# Patient Record
Sex: Female | Born: 1935
Health system: Southern US, Community
[De-identification: ages and names within clinical notes are randomized; demographics above are authoritative.]

## PROBLEM LIST (undated history)

## (undated) DIAGNOSIS — I499 Cardiac arrhythmia, unspecified: Secondary | ICD-10-CM

## (undated) HISTORY — PX: CHOLECYSTECTOMY: SHX55

---

## 2015-04-20 ENCOUNTER — Observation Stay (HOSPITAL_COMMUNITY)
Admission: AD | Admit: 2015-04-20 | Discharge: 2015-04-22 | Disposition: A | Discharge status: Skilled Nursing Facility | Payer: Medicare Other | Source: Other Acute Inpatient Hospital | Attending: Internal Medicine | Admitting: Internal Medicine

## 2015-04-20 ENCOUNTER — Telehealth: Payer: Self-pay | Admitting: Family Medicine

## 2015-04-20 DIAGNOSIS — R296 Repeated falls: Secondary | ICD-10-CM | POA: Insufficient documentation

## 2015-04-20 DIAGNOSIS — Z9181 History of falling: Secondary | ICD-10-CM | POA: Insufficient documentation

## 2015-04-20 DIAGNOSIS — S12000A Unspecified displaced fracture of first cervical vertebra, initial encounter for closed fracture: Principal | ICD-10-CM | POA: Insufficient documentation

## 2015-04-20 DIAGNOSIS — Y92009 Unspecified place in unspecified non-institutional (private) residence as the place of occurrence of the external cause: Secondary | ICD-10-CM | POA: Diagnosis not present

## 2015-04-20 DIAGNOSIS — F0390 Unspecified dementia without behavioral disturbance: Secondary | ICD-10-CM | POA: Diagnosis not present

## 2015-04-20 DIAGNOSIS — I482 Chronic atrial fibrillation, unspecified: Secondary | ICD-10-CM | POA: Diagnosis present

## 2015-04-20 DIAGNOSIS — W1800XA Striking against unspecified object with subsequent fall, initial encounter: Secondary | ICD-10-CM | POA: Diagnosis not present

## 2015-04-20 DIAGNOSIS — S12111A Posterior displaced Type II dens fracture, initial encounter for closed fracture: Secondary | ICD-10-CM | POA: Insufficient documentation

## 2015-04-20 DIAGNOSIS — W19XXXA Unspecified fall, initial encounter: Secondary | ICD-10-CM

## 2015-04-20 HISTORY — DX: Cardiac arrhythmia, unspecified: I49.9

## 2015-04-20 NOTE — Telephone Encounter (Signed)
Accepted to med-surg bed at [facility] in transfer from [facility].  [place] while getting out of a chair at home and suffered C-1 Jefferson fx, dens fracture. She is hemodynamically stable and no without neuro deficit. Dr. Venetia Maxon of [facility] is consulting, but is not anticipating any surgical intervention.

## 2015-04-20 NOTE — Progress Notes (Signed)
Pt admitted from [facility] after a fall, alert and oriented with [place] neck collar in place, pt settled in bed with family and call light at bedside, admitting doc paged for orders, pt reassured, will however continue to monitor. Obasogie-Asidi, Peretz Thieme Efe

## 2015-04-20 NOTE — Progress Notes (Signed)
Pt is yet to be seen by the admitting doctor, pt's family getting concerned, [facility] Admissions re-paged and notified, yet to call back. Obasogie-Asidi, Zuly Belkin Efe

## 2015-04-21 ENCOUNTER — Observation Stay (HOSPITAL_COMMUNITY): Payer: Medicare Other

## 2015-04-21 ENCOUNTER — Encounter (HOSPITAL_COMMUNITY): Payer: Self-pay | Admitting: Internal Medicine

## 2015-04-21 DIAGNOSIS — I482 Chronic atrial fibrillation, unspecified: Secondary | ICD-10-CM | POA: Diagnosis present

## 2015-04-21 DIAGNOSIS — S12000A Unspecified displaced fracture of first cervical vertebra, initial encounter for closed fracture: Secondary | ICD-10-CM | POA: Diagnosis present

## 2015-04-21 LAB — COMPREHENSIVE METABOLIC PANEL
ALT: 14 U/L (ref 14–54)
AST: 24 U/L (ref 15–41)
Albumin: 3.5 g/dL (ref 3.5–5.0)
Alkaline Phosphatase: 48 U/L (ref 38–126)
Anion gap: 11 (ref 5–15)
BUN: 19 mg/dL (ref 6–20)
CALCIUM: 9.3 mg/dL (ref 8.9–10.3)
CHLORIDE: 104 mmol/L (ref 101–111)
CO2: 26 mmol/L (ref 22–32)
CREATININE: 0.71 mg/dL (ref 0.44–1.00)
Glucose, Bld: 124 mg/dL — ABNORMAL HIGH (ref 65–99)
Potassium: 4.3 mmol/L (ref 3.5–5.1)
Sodium: 141 mmol/L (ref 135–145)
Total Bilirubin: 1.1 mg/dL (ref 0.3–1.2)
Total Protein: 5.8 g/dL — ABNORMAL LOW (ref 6.5–8.1)

## 2015-04-21 LAB — CBC WITH DIFFERENTIAL/PLATELET
BASOS PCT: 0 %
Basophils Absolute: 0 10*3/uL (ref 0.0–0.1)
EOS ABS: 0 10*3/uL (ref 0.0–0.7)
Eosinophils Relative: 1 %
HCT: 33.9 % — ABNORMAL LOW (ref 36.0–46.0)
HEMOGLOBIN: 11.4 g/dL — AB (ref 12.0–15.0)
LYMPHS ABS: 0.9 10*3/uL (ref 0.7–4.0)
Lymphocytes Relative: 20 %
MCH: 33.7 pg (ref 26.0–34.0)
MCHC: 33.6 g/dL (ref 30.0–36.0)
MCV: 100.3 fL — ABNORMAL HIGH (ref 78.0–100.0)
Monocytes Absolute: 0.4 10*3/uL (ref 0.1–1.0)
Monocytes Relative: 9 %
NEUTROS PCT: 70 %
Neutro Abs: 3.3 10*3/uL (ref 1.7–7.7)
Platelets: 181 10*3/uL (ref 150–400)
RBC: 3.38 MIL/uL — AB (ref 3.87–5.11)
RDW: 13.2 % (ref 11.5–15.5)
WBC: 4.7 10*3/uL (ref 4.0–10.5)

## 2015-04-21 MED ORDER — DONEPEZIL HCL 10 MG PO TABS
10.0000 mg | ORAL_TABLET | Freq: Every day | ORAL | Status: DC
  Administered 2015-04-21: 10 mg via ORAL
  Filled 2015-04-21: qty 1

## 2015-04-21 MED ORDER — ONDANSETRON HCL 4 MG/2ML IJ SOLN
4.0000 mg | Freq: Four times a day (QID) | INTRAMUSCULAR | Status: DC | PRN
  Filled 2015-04-21: qty 2

## 2015-04-21 MED ORDER — ONDANSETRON HCL 4 MG PO TABS
4.0000 mg | ORAL_TABLET | Freq: Four times a day (QID) | ORAL | Status: DC | PRN
  Administered 2015-04-22: 4 mg via ORAL

## 2015-04-21 MED ORDER — ACETAMINOPHEN 650 MG RE SUPP: 650.0000 mg | Freq: Four times a day (QID) | RECTAL | Status: DC | PRN

## 2015-04-21 MED ORDER — DIGOXIN 125 MCG PO TABS
0.1250 mg | ORAL_TABLET | Freq: Every day | ORAL | Status: DC
  Administered 2015-04-21 – 2015-04-22 (×2): 0.125 mg via ORAL
  Filled 2015-04-21 (×2): qty 1

## 2015-04-21 MED ORDER — TRAMADOL HCL 50 MG PO TABS
50.0000 mg | ORAL_TABLET | Freq: Four times a day (QID) | ORAL | Status: DC | PRN
  Administered 2015-04-21 – 2015-04-22 (×2): 50 mg via ORAL
  Filled 2015-04-21 (×2): qty 1

## 2015-04-21 MED ORDER — DONEPEZIL HCL 10 MG PO TBDP: 10.0000 mg | ORAL_TABLET | Freq: Every day | ORAL | Status: DC

## 2015-04-21 MED ORDER — DILTIAZEM HCL ER COATED BEADS 120 MG PO CP24
120.0000 mg | ORAL_CAPSULE | Freq: Every day | ORAL | Status: DC
  Administered 2015-04-21 – 2015-04-22 (×2): 120 mg via ORAL
  Filled 2015-04-21 (×4): qty 1

## 2015-04-21 MED ORDER — ACETAMINOPHEN 325 MG PO TABS
650.0000 mg | ORAL_TABLET | Freq: Four times a day (QID) | ORAL | Status: DC | PRN
  Administered 2015-04-22: 650 mg via ORAL
  Filled 2015-04-21: qty 2

## 2015-04-21 NOTE — Progress Notes (Signed)
PT Cancellation Note  Patient Details Name: Melinda Warner MRN: [id number] DOB: Jan 06, 1926   Cancelled Treatment:    Reason Eval/Treat Not Completed: Noted cervical fractures and neurosurgery consult pending. Will await results of this consult prior to initiating activity. Will follow and hope to see later today.   Gionna Polak 04/21/2015, 8:26 AM  Pager [phone]

## 2015-04-21 NOTE — Progress Notes (Signed)
Subjective: Patient reports "my neck (hurts) when I move some"   Objective: Vital signs in last 24 hours: Temp:  [97.5 F (36.4 C)-97.8 F (36.6 C)] 97.5 F (36.4 C) (02/06 0602) Pulse Rate:  [65-73] 65 (02/06 0602) Resp:  [18] 18 (02/06 0602) BP: (133-154)/(67-78) 135/67 mmHg (02/06 0602) SpO2:  [96 %-100 %] 96 % (02/06 0602) Weight:  [54.023 kg (119 lb 1.6 oz)] 54.023 kg (119 lb 1.6 oz) (02/05 2147)  Intake/Output from previous day:   Intake/Output this shift:    Alert, conversant, (hard of hearing) preparing to eat breakfast. MAEW. Has been up to [facility] with assistance.  [place] Collar in place. DSD to small forehead laceration.   Lab Results:  Recent Labs  04/21/15 0139  WBC 4.7  HGB 11.4*  HCT 33.9*  PLT 181   BMET  Recent Labs  04/21/15 0139  NA 141  K 4.3  CL 104  CO2 26  GLUCOSE 124*  BUN 19  CREATININE 0.71  CALCIUM 9.3    Studies/Results: Dg Pelvis Portable  04/21/2015  CLINICAL DATA:  82 year old female with fall EXAM: PORTABLE PELVIS 1-2 VIEWS COMPARISON:  None. FINDINGS: Evaluation for fracture is limited due to osteopenia. No definite acute fracture identified. There is no dislocation. There is advanced osteopenia. There are degenerative changes of the visualized lower spine. Moderate stool noted throughout the colon. IMPRESSION: Osteopenia.  No definite acute fracture. Electronically Signed   By: Elgie Collard M.D.   On: 04/21/2015 01:57    Assessment/Plan:   LOS: 1 day  Attempting to locate cervical images obtained at [facility]. If unable to locate, will need to repeat cervical CT. Dr Venetia Maxon will visit this am.    Georgiann Cocker 04/21/2015, 8:15 AM

## 2015-04-21 NOTE — Progress Notes (Signed)
Pt's urine was foul smelling.  MD made aware.  Will continue to monitor closely and update as needed.

## 2015-04-21 NOTE — Progress Notes (Signed)
Patient admitted after midnight, please see H&P.  No plan for surgery per NS-- will need change in neck brace, PT and follow up with Dr. Venetia Maxon Family at bedside-- plan to take patient home vs rehab (discussed observation status with family)  Marlin Canary DO

## 2015-04-21 NOTE — Evaluation (Signed)
Physical Therapy Evaluation Patient Details Name: Melinda Warner MRN: [id number] DOB: [birth year] Today's Date: 04/21/2015   History of Present Illness  Melinda Warner is a 82 y.o. female with history of atrial fibrillation and had a fall at her house when patient was trying to walk. Patient states she got up from the bed and was trying to walk when she lost her balance and fell. She hit her head. Denies losing consciousness. Denies any palpitations or shortness of breath. After the fall patient had neck. CT scan of the head and neck done showed acute fracture involving the dens with mild posterior displacement and acute Jefferson fracture of the bilateral posterior arches of C1. Since patient will require neurosurgery follow-up Dr. Venetia Maxon, on-call neurosurgeon was consulted and patient was transferred to [facility]  Clinical Impression  Pt with the above acute medical history presents with the PT deficits (listed below). Today the patient is requiring Mod A for safe ambulation as pt displays impulsive behavior and limited safety awareness with use of SPC and RW. Pt become more stable with RW however demonstrates excessive kyphotic posture. Deferred stair training today due decreased mobility and increased neck pain. Pt will benefit from continue acute PT services and recommending D/C to SNF to maximize recovery to Mod I for safe transition home. If family chooses to take pt home she would need 24/7 supervision/assistance for all transfers and mobility for safety to decrease fall risk.    Follow Up Recommendations SNF;Supervision/Assistance - 24 hour    Equipment Recommendations  None recommended by PT    Recommendations for Other Services OT consult     Precautions / Restrictions Precautions Precautions: Fall;Cervical Precaution Comments: C1-C2 fracture Required Braces or Orthoses: Cervical Brace Cervical Brace: Hard collar;At all times      Mobility  Bed Mobility Overal bed mobility: Needs  Assistance Bed Mobility: Sit to Supine       Sit to supine: Min assist   General bed mobility comments: Min assist to bring LE onto bed and to scoot to [facility]  Transfers Overall transfer level: Needs assistance Equipment used: Rolling walker (2 wheeled) Transfers: Sit to/from Stand Sit to Stand: Min assist         General transfer comment: Min A to stand from chair, hand placements for safe transfers on armrests, unable to achieve upright trunk due to severe kyphotic posture  Ambulation/Gait Ambulation/Gait assistance: Mod assist Ambulation Distance (Feet): 60 Feet (30 ft x 2(seated break)) Assistive device: Straight cane;Rolling walker (2 wheeled) Gait Pattern/deviations: Step-through pattern;Decreased stride length;Shuffle;Leaning posteriorly;Drifts right/left;Trunk flexed Gait velocity: slow Gait velocity interpretation: <1.8 ft/sec, indicative of risk for recurrent falls General Gait Details: Pt does not display safe use of RW or SPC despite verbal cues for sequencing, management of RW required, lurching, picking up RW off of ground  Stairs            Wheelchair Mobility    Modified Rankin (Stroke Patients Only)       Balance Overall balance assessment: Needs assistance Sitting-balance support: Feet supported;Bilateral upper extremity supported Sitting balance-Leahy Scale: Fair     Standing balance support: Bilateral upper extremity supported;During functional activity Standing balance-Leahy Scale: Poor Standing balance comment: relies on RW                             Pertinent Vitals/Pain Pain Assessment: Faces Faces Pain Scale: Hurts whole lot Pain Location: neck Pain Descriptors / Indicators: Aching;Grimacing;Guarding Pain Intervention(s): Limited  activity within patient's tolerance;Monitored during session    Home Living Family/patient expects to be discharged to:: Skilled nursing facility Living Arrangements: Alone                Additional Comments: Pt lives alone has family nearby but unable to reach them by phone, pt not safe to return home independently, pt was walking with a cane and has 4 STE house and was driving    Prior Function Level of Independence: Independent with assistive device(s)               Hand Dominance   Dominant Hand: Right    Extremity/Trunk Assessment   Upper Extremity Assessment: Generalized weakness           Lower Extremity Assessment: Generalized weakness      Cervical / Trunk Assessment: Kyphotic  Communication   Communication: No difficulties;HOH  Cognition Arousal/Alertness: Awake/alert Behavior During Therapy: Impulsive Overall Cognitive Status: No family/caregiver present to determine baseline cognitive functioning       Memory: Decreased recall of precautions              General Comments General comments (skin integrity, edema, etc.): neck pain limiting pt t/o session    Exercises        Assessment/Plan    PT Assessment Patient needs continued PT services  PT Diagnosis Abnormality of gait;Difficulty walking;Acute pain;Generalized weakness   PT Problem List Decreased strength;Decreased range of motion;Decreased activity tolerance;Decreased balance;Decreased mobility;Decreased coordination;Decreased cognition;Decreased knowledge of use of DME;Decreased safety awareness;Decreased knowledge of precautions;Pain  PT Treatment Interventions DME instruction;Gait training;Stair training;Functional mobility training;Therapeutic activities;Therapeutic exercise;Balance training;Neuromuscular re-education   PT Goals (Current goals can be found in the Care Plan section) Acute Rehab PT Goals Patient Stated Goal: get collar off PT Goal Formulation: With patient Time For Goal Achievement: 05/05/15 Potential to Achieve Goals: Good    Frequency Min 3X/week   Barriers to discharge Inaccessible home environment      Co-evaluation                End of Session Equipment Utilized During Treatment: Cervical collar;Gait belt Activity Tolerance: Patient limited by pain Patient left: in bed;with call bell/phone within reach;with bed alarm set Nurse Communication: Mobility status         Time: 1453-1520 PT Time Calculation (min) (ACUTE ONLY): 27 min   Charges:   PT Evaluation $PT Eval Moderate Complexity: 1 Procedure PT Treatments $Gait Training: 8-22 mins   PT G Codes:        Ulyses Jarred 05/07/15, 4:03 PM  Ulyses Jarred, Student Physical Therapist Acute Rehab [phone]

## 2015-04-21 NOTE — H&P (Signed)
[facility] History and Physical  Melinda Warner ZOX:[id number] DOB: [birth year] DOA: 04/20/2015  Referring physician: Patient was transferred from [facility]. PCP: No primary care provider on file.  Specialists: None.  Chief Complaint: Fall.  HPI: Melinda Warner is a 82 y.o. female with history of atrial fibrillation and had a fall at her house when patient was trying to walk. Patient states she got up from the bed and was trying to walk when she lost her balance and fell. She hit her head. Denies losing consciousness. Denies any palpitations or shortness of breath. After the fall patient had neck. CT scan of the head and neck done showed acute fracture involving the dens with mild posterior displacement and acute Jefferson fracture of the bilateral posterior arches of C1. Since patient will require neurosurgery follow-up Dr. Venetia Maxon, on-call neurosurgeon was consulted and patient was transferred to [facility]. On my exam patient is not in acute distress and is moving all extremities. Reviewing patient's labs at [facility] patient's digoxin level was 1.4 rest of the labs were largely unremarkable. EKG was showing A. fib with PVCs. Rate control.  Review of Systems: As presented in the history of presenting illness, rest negative.  Past Medical History  Diagnosis Date  . Dysrhythmia    Past Surgical History  Procedure Laterality Date  . Cholecystectomy     Social History:  reports that she has never smoked. She does not have any smokeless tobacco history on file. She reports that she does not drink alcohol or use illicit drugs. Where does patient live home. Can patient participate in ADLs? Yes.  Not on File  Family History:  Family History  Problem Relation Age of Onset  . CAD Mother   . CAD Father       Prior to Admission medications   Not on File    Physical Exam: Filed Vitals:   04/20/15 2147  BP: 154/68  Pulse: 73  Temp: 97.8 F (36.6 C)  TempSrc:  Axillary  Resp: 18  Weight: 54.023 kg (119 lb 1.6 oz)  SpO2: 100%     General:  Moderately built and nourished.  Eyes: Anicteric no pallor.  ENT: No discharge from the ears eyes nose or mouth.  Neck: Patient has a neck collar.  Cardiovascular: S1 and S2 heard.  Respiratory: No rhonchi or crepitations.  Abdomen: Soft nontender bowel sounds present. No guarding or rigidity.  Skin: No rash.  Musculoskeletal: No edema.  Psychiatric: Appears normal.  Neurologic: Alert awake oriented to time place and person. Moves all extremities.  Labs on Admission:  Basic Metabolic Panel: No results for input(s): NA, K, CL, CO2, GLUCOSE, BUN, CREATININE, CALCIUM, MG, PHOS in the last 168 hours. Liver Function Tests: No results for input(s): AST, ALT, ALKPHOS, BILITOT, PROT, ALBUMIN in the last 168 hours. No results for input(s): LIPASE, AMYLASE in the last 168 hours. No results for input(s): AMMONIA in the last 168 hours. CBC: No results for input(s): WBC, NEUTROABS, HGB, HCT, MCV, PLT in the last 168 hours. Cardiac Enzymes: No results for input(s): CKTOTAL, CKMB, CKMBINDEX, TROPONINI in the last 168 hours.  BNP (last 3 results) No results for input(s): BNP in the last 8760 hours.  ProBNP (last 3 results) No results for input(s): PROBNP in the last 8760 hours.  CBG: No results for input(s): GLUCAP in the last 168 hours.  Radiological Exams on Admission: No results found.  EKG: Independently reviewed. A. fib with rate control with PVCs.  Assessment/Plan Principal Problem:  C1 cervical fracture (HCC) Active Problems:   Chronic atrial fibrillation (HCC)   1. Acute type II fracture of the dens with mild posterior displacement and acute Jefferson fracture of the bilateral posterior arches of C1 - patient is on neck collar. I have also place patient on when necessary tramadol for pain. Further recommendations per Dr. Venetia Maxon on call neurosurgeon. 2. Chronic atrial fibrillation -  old records not available. Patient's rate is controlled. Continue Cardizem and digoxin. Digoxin levels were therapeutic level at around 1.4. Patient is not on anticoagulation probably secondary to falls.   DVT Prophylaxis SCDs.  Code Status: Full code.  Family Communication: Discussed with patient.  Disposition Plan: Admit for observation.    Rudra Hobbins N. [facility] Pager [phone].  If 7PM-7AM, please contact night-coverage [email or web address] Password [email or web address] 04/21/2015, 12:59 AM

## 2015-04-21 NOTE — Consult Note (Signed)
Reason for Consult:cervical fracture Referring Physician: Malayshia All is an 82 y.o. female.  HPI: Patient is 82 yo female who fell and struck her head.  Tx from [facility] with C 1 and C 2 fracures (Jefferson and minimally displaced Type 2 odontoid).  Patient has h/o A Fib.  She has h/o falling, but says she has not fallen in 2 years.  Her neck is sore, but she denies weakness or numbness in extremities.  She says she does not want surgery.  Past Medical History  Diagnosis Date  . Dysrhythmia     Past Surgical History  Procedure Laterality Date  . Cholecystectomy      Family History  Problem Relation Age of Onset  . CAD Mother   . CAD Father     Social History:  reports that she has never smoked. She does not have any smokeless tobacco history on file. She reports that she does not drink alcohol or use illicit drugs.  Allergies:  Allergies  Allergen Reactions  . Tuberculin     Medications: I have reviewed the patient's current medications.  Results for orders placed or performed during the hospital encounter of 04/20/15 (from the past 48 hour(s))  Comprehensive metabolic panel     Status: Abnormal   Collection Time: 04/21/15  1:39 AM  Result Value Ref Range   Sodium 141 135 - 145 mmol/L   Potassium 4.3 3.5 - 5.1 mmol/L   Chloride 104 101 - 111 mmol/L   CO2 26 22 - 32 mmol/L   Glucose, Bld 124 (H) 65 - 99 mg/dL   BUN 19 6 - 20 mg/dL   Creatinine, Ser 0.71 0.44 - 1.00 mg/dL   Calcium 9.3 8.9 - 10.3 mg/dL   Total Protein 5.8 (L) 6.5 - 8.1 g/dL   Albumin 3.5 3.5 - 5.0 g/dL   AST 24 15 - 41 U/L   ALT 14 14 - 54 U/L   Alkaline Phosphatase 48 38 - 126 U/L   Total Bilirubin 1.1 0.3 - 1.2 mg/dL   GFR calc non Af Amer >60 >60 mL/min   GFR calc Af Amer >60 >60 mL/min    Comment: (NOTE) The eGFR has been calculated using the CKD EPI equation. This calculation has not been validated in all clinical situations. eGFR's persistently <60 mL/min signify possible Chronic  Kidney Disease.    Anion gap 11 5 - 15  CBC with Differential/Platelet     Status: Abnormal   Collection Time: 04/21/15  1:39 AM  Result Value Ref Range   WBC 4.7 4.0 - 10.5 K/uL   RBC 3.38 (L) 3.87 - 5.11 MIL/uL   Hemoglobin 11.4 (L) 12.0 - 15.0 g/dL   HCT 33.9 (L) 36.0 - 46.0 %   MCV 100.3 (H) 78.0 - 100.0 fL   MCH 33.7 26.0 - 34.0 pg   MCHC 33.6 30.0 - 36.0 g/dL   RDW 13.2 11.5 - 15.5 %   Platelets 181 150 - 400 K/uL   Neutrophils Relative % 70 %   Neutro Abs 3.3 1.7 - 7.7 K/uL   Lymphocytes Relative 20 %   Lymphs Abs 0.9 0.7 - 4.0 K/uL   Monocytes Relative 9 %   Monocytes Absolute 0.4 0.1 - 1.0 K/uL   Eosinophils Relative 1 %   Eosinophils Absolute 0.0 0.0 - 0.7 K/uL   Basophils Relative 0 %   Basophils Absolute 0.0 0.0 - 0.1 K/uL    Dg Pelvis Portable  04/21/2015  CLINICAL DATA:  82 year old female with fall EXAM: PORTABLE PELVIS 1-2 VIEWS COMPARISON:  None. FINDINGS: Evaluation for fracture is limited due to osteopenia. No definite acute fracture identified. There is no dislocation. There is advanced osteopenia. There are degenerative changes of the visualized lower spine. Moderate stool noted throughout the colon. IMPRESSION: Osteopenia.  No definite acute fracture. Electronically Signed   By: Anner Crete M.D.   On: 04/21/2015 01:57    Review of Systems - Negative except A Fib, falls    Blood pressure 135/67, pulse 65, temperature 97.5 F (36.4 C), temperature source Oral, resp. rate 18, weight 54.023 kg (119 lb 1.6 oz), SpO2 96 %. Physical Exam  Constitutional: She is oriented to person, place, and time. She appears well-developed and well-nourished.  HENT:  Head: Normocephalic.  Bandage on frontal vertex  Eyes: EOM are normal. Pupils are equal, round, and reactive to light.  Neck:  Complains of neck pain, in collar (too large, puts her in mild extension)  Musculoskeletal: Normal range of motion.  Neurological: She is alert and oriented to person, place, and  time. She has normal strength and normal reflexes. No cranial nerve deficit or sensory deficit. GCS eye subscore is 4. GCS verbal subscore is 5. GCS motor subscore is 6.  Skin: Skin is warm, dry and intact.  Psychiatric: She has a normal mood and affect. Her speech is normal and behavior is normal. Judgment and thought content normal. Cognition and memory are normal.    Assessment/Plan: Patient has C 1 and C 2 fractures with A Fib and falling.  Collar is too large.  Please change to [place] collar (ordered).  Mobilize with PT, may require placement.  No need for surgery.  I will follow patient now and as outpatient.  Peggyann Shoals, MD 04/21/2015, 8:58 AM

## 2015-04-21 NOTE — Care Management Note (Signed)
Case Management Note  Patient Details  Name: Melinda Warner MRN: [id number] Date of Birth: 1925-04-10  Subjective/Objective:                    Action/Plan: Patient was hospitalized after a fall with C1 fracture.  Will follow for discharge needs pending PT/OT evals and physician orders.  Expected Discharge Date:                  Expected Discharge Plan:     In-House Referral:     Discharge planning Services     Post Acute Care Choice:    Choice offered to:     DME Arranged:    DME Agency:     HH Arranged:    HH Agency:     Status of Service:  In process, will continue to follow  Medicare Important Message Given:    Date Medicare IM Given:    Medicare IM give by:    Date Additional Medicare IM Given:    Additional Medicare Important Message give by:     If discussed at Long Length of Stay Meetings, dates discussed:    Additional CommentsAnda Kraft, RN 04/21/2015, 4:01 PM [phone]

## 2015-04-22 DIAGNOSIS — S12000A Unspecified displaced fracture of first cervical vertebra, initial encounter for closed fracture: Secondary | ICD-10-CM | POA: Diagnosis not present

## 2015-04-22 DIAGNOSIS — I482 Chronic atrial fibrillation: Secondary | ICD-10-CM

## 2015-04-22 DIAGNOSIS — R296 Repeated falls: Secondary | ICD-10-CM

## 2015-04-22 DIAGNOSIS — F0390 Unspecified dementia without behavioral disturbance: Secondary | ICD-10-CM | POA: Diagnosis present

## 2015-04-22 MED ORDER — TRAMADOL HCL 50 MG PO TABS: 50.0000 mg | ORAL_TABLET | Freq: Four times a day (QID) | ORAL | Status: AC | PRN

## 2015-04-22 MED ORDER — PROMETHAZINE HCL 25 MG/ML IJ SOLN
12.5000 mg | Freq: Once | INTRAMUSCULAR | Status: AC
  Administered 2015-04-22: 12.5 mg via INTRAVENOUS
  Filled 2015-04-22: qty 1

## 2015-04-22 NOTE — Clinical Social Work Placement (Signed)
   CLINICAL SOCIAL WORK PLACEMENT  NOTE  Date:  04/22/2015  Patient Details  Name: Melinda Warner MRN: [id number] Date of Birth: 16-May-1925  Clinical Social Work is seeking post-discharge placement for this patient at the Skilled  Nursing Facility level of care (*CSW will initial, date and re-position this form in  chart as items are completed):  Yes   Patient/family provided with [place] Clinical Social Work Department's list of facilities offering this level of care within the geographic area requested by the patient (or if unable, by the patient's family).  Yes   Patient/family informed of their freedom to choose among providers that offer the needed level of care, that participate in Medicare, Medicaid or managed care program needed by the patient, have an available bed and are willing to accept the patient.  Yes   Patient/family informed of [facility]'s ownership interest in [facility] and [facility], as well as of the fact that they are under no obligation to receive care at these facilities.  PASRR submitted to EDS on       PASRR number received on       Existing PASRR number confirmed on       FL2 transmitted to all facilities in geographic area requested by pt/family on 04/22/15     FL2 transmitted to all facilities within larger geographic area on       Patient informed that his/her managed care company has contracts with or will negotiate with certain facilities, including the following:            Patient/family informed of bed offers received.  Patient chooses bed at       Physician recommends and patient chooses bed at      Patient to be transferred to   on  .  Patient to be transferred to facility by       Patient family notified on   of transfer.  Name of family member notified:        PHYSICIAN Please sign FL2     Additional Comment:    _______________________________________________ Orson Gear, Student-SW 04/22/2015, 10:31 AM

## 2015-04-22 NOTE — Clinical Social Work Note (Signed)
BLUE Medicare auth obtained: [id number].  Clinical Social Worker facilitated patient discharge including contacting patient family and facility to confirm patient discharge plans.  Clinical information faxed to facility and family agreeable with plan.  CSW arranged ambulance transport via [facility] to [facility].  RN to call report prior to discharge.  Clinical Social Worker will sign off for now as social work intervention is no longer needed. Please consult [place] again if new need arises.  Derenda Fennel, MSW, LCSWA [phone] 04/22/2015 2:10 PM

## 2015-04-22 NOTE — Care Management Obs Status (Signed)
MEDICARE OBSERVATION STATUS NOTIFICATION   Patient Details  Name: Melinda Warner MRN: [id number] Date of Birth: 11/14/1925   Medicare Observation Status Notification Given:  Yes    Kermit Balo, RN 04/22/2015, 10:34 AM

## 2015-04-22 NOTE — Progress Notes (Signed)
Physical Therapy Treatment Patient Details Name: Melinda Warner MRN: [id number] DOB: 02-Oct-1925 Today's Date: 04/22/2015    History of Present Illness Melinda Warner is a 82 y.o. female with history of atrial fibrillation and had a fall at her house when patient was trying to walk. Patient states she got up from the bed and was trying to walk when she lost her balance and fell. She hit her head. Denies losing consciousness. Denies any palpitations or shortness of breath. After the fall patient had neck. CT scan of the head and neck done showed acute fracture involving the dens with mild posterior displacement and acute Jefferson fracture of the bilateral posterior arches of C1. Since patient will require neurosurgery follow-up Dr. Venetia Maxon, on-call neurosurgeon was consulted and patient was transferred to [facility]    PT Comments    Attempting to progress mobility with patient as tolerated. Assistance needed for safety throughout session. Patient noted to reach for objects in room and leaving rw behind at times. Based upon the patient's current mobility, recommending SNF for further rehabilitation before returning home alone. Patient and family in agreement.   Follow Up Recommendations  SNF;Supervision/Assistance - 24 hour     Equipment Recommendations  None recommended by PT    Recommendations for Other Services       Precautions / Restrictions Precautions Precautions: Fall;Cervical Precaution Comments: C1-C2 fracture Required Braces or Orthoses: Cervical Brace Cervical Brace: Hard collar;At all times Restrictions Weight Bearing Restrictions: No    Mobility  Bed Mobility               General bed mobility comments: up in chair upon arrival  Transfers Overall transfer level: Needs assistance Equipment used: Rolling walker (2 wheeled) Transfers: Sit to/from Stand Sit to Stand: Min assist         General transfer comment: min assist, cues for hand  placement  Ambulation/Gait Ambulation/Gait assistance: Min guard Ambulation Distance (Feet): 45 Feet (15' X1, 30' X1) Assistive device: Rolling walker (2 wheeled) Gait Pattern/deviations: Step-through pattern;Decreased step length - right;Decreased step length - left;Trunk flexed Gait velocity: decreased   General Gait Details: cues for safe use of rw and cues for posture during ambulation.    Stairs            Wheelchair Mobility    Modified Rankin (Stroke Patients Only)       Balance Overall balance assessment: Needs assistance Sitting-balance support: No upper extremity supported Sitting balance-Leahy Scale: Fair     Standing balance support: Bilateral upper extremity supported Standing balance-Leahy Scale: Poor Standing balance comment: using rw                    Cognition Arousal/Alertness: Awake/alert Behavior During Therapy: WFL for tasks assessed/performed Overall Cognitive Status:  (decreased safety awareness)                      Exercises      General Comments General comments (skin integrity, edema, etc.): Patient with poor safety awareness duirng session, attempting short ambulation in room without rw, reaching for objects in room to assist with balance.       Pertinent Vitals/Pain Pain Assessment: Faces Faces Pain Scale: Hurts little more Pain Location: neck Pain Descriptors / Indicators: Grimacing Pain Intervention(s): Monitored during session    Home Living                      Prior Function  PT Goals (current goals can now be found in the care plan section) Acute Rehab PT Goals Patient Stated Goal: get to the bathroom PT Goal Formulation: With patient Time For Goal Achievement: 05/05/15 Potential to Achieve Goals: Good Progress towards PT goals: Progressing toward goals    Frequency  Min 3X/week    PT Plan Current plan remains appropriate    Co-evaluation             End of  Session Equipment Utilized During Treatment: Cervical collar;Gait belt Activity Tolerance: Patient limited by fatigue Patient left: in chair;with call bell/phone within reach;with family/visitor present;with chair alarm set     Time: 1610-9604 PT Time Calculation (min) (ACUTE ONLY): 16 min  Charges:  $Gait Training: 8-22 mins                    G Codes:      Christiane Ha, PT, CSCS Pager [phone] Office [phone]  04/22/2015, 12:58 PM

## 2015-04-22 NOTE — NC FL2 (Signed)
  [place] MEDICAID FL2 LEVEL OF CARE SCREENING TOOL     IDENTIFICATION  Patient Name: Melinda Warner Birthdate: 01-27-1926 Sex: female Admission Date (Current Location): 04/20/2015  [facility] and [place] Number:  Producer, television/film/video and Address:  The . [facility], [address] [place], [place] [address]      Provider Number: [id number]  Attending Physician Name and Address:  Jeralyn Bennett, MD  Relative Name and Phone Number:       Current Level of Care: Hospital Recommended Level of Care: Skilled Nursing Facility Prior Approval Number:    Date Approved/Denied:   PASRR Number: [id number]  Discharge Plan: SNF    Current Diagnoses: Patient Active Problem List   Diagnosis Date Noted  . C1 cervical fracture (HCC) 04/21/2015  . Chronic atrial fibrillation (HCC) 04/21/2015    Orientation RESPIRATION BLADDER Height & Weight     Self, Time, Situation, Place  Normal Continent Weight: 119 lb 1.6 oz (54.023 kg) Height:     BEHAVIORAL SYMPTOMS/MOOD NEUROLOGICAL BOWEL NUTRITION STATUS   (NONE )  (NONE ) Continent Diet (HEART HEALTHY )  AMBULATORY STATUS COMMUNICATION OF NEEDS Skin   Limited Assist Verbally Normal                       Personal Care Assistance Level of Assistance  Bathing, Dressing Bathing Assistance: Limited assistance   Dressing Assistance: Limited assistance     Functional Limitations Info   (NONE )          SPECIAL CARE FACTORS FREQUENCY  PT (By licensed PT)     PT Frequency: 3              Contractures      Additional Factors Info  Code Status, Allergies Code Status Info: FULL CODE  Allergies Info: TUBERCULIN           Current Medications (04/22/2015):  This is the current hospital active medication list Current Facility-Administered Medications  Medication Dose Route Frequency Provider Last Rate Last Dose  . acetaminophen (TYLENOL) tablet 650 mg  650 mg Oral Q6H PRN Eduard Clos, MD   650 mg  at 04/22/15 0059   Or  . acetaminophen (TYLENOL) suppository 650 mg  650 mg Rectal Q6H PRN Eduard Clos, MD      . digoxin Margit Banda) tablet 0.125 mg  0.125 mg Oral Daily Joseph Art, DO   0.125 mg at 04/21/15 1417  . diltiazem (CARDIZEM CD) 24 hr capsule 120 mg  120 mg Oral Daily Joseph Art, DO   120 mg at 04/21/15 1417  . donepezil (ARICEPT) tablet 10 mg  10 mg Oral QHS Scarlett Presto, RPH   10 mg at 04/21/15 2203  . ondansetron (ZOFRAN) tablet 4 mg  4 mg Oral Q6H PRN Eduard Clos, MD   4 mg at 04/22/15 0200   Or  . ondansetron (ZOFRAN) injection 4 mg  4 mg Intravenous Q6H PRN Eduard Clos, MD      . traMADol Janean Sark) tablet 50 mg  50 mg Oral Q6H PRN Eduard Clos, MD   50 mg at 04/21/15 0235     Discharge Medications: Please see discharge summary for a list of discharge medications.  Relevant Imaging Results:  Relevant Lab Results:   Additional Information SSN [id number]  Vaughan Browner, LCSW

## 2015-04-22 NOTE — Progress Notes (Signed)
Subjective: Patient reports "I'm just resting"  Objective: Vital signs in last 24 hours: Temp:  [97.6 F (36.4 C)-98.4 F (36.9 C)] 97.6 F (36.4 C) (02/07 0600) Pulse Rate:  [71-89] 80 (02/07 0600) Resp:  [16-20] 16 (02/07 0600) BP: (116-159)/(63-86) 131/65 mmHg (02/07 0600) SpO2:  [97 %-100 %] 99 % (02/07 0600)  Intake/Output from previous day: 02/06 0701 - 02/07 0700 In: 240 [P.O.:240] Out: -  Intake/Output this shift:    Opens eyes to voice, sitting in chair. Reports only mild neck discomfort "when I move my neck". Vista collar in use, in good position. MAEW with good strength.   Lab Results:  Recent Labs  04/21/15 0139  WBC 4.7  HGB 11.4*  HCT 33.9*  PLT 181   BMET  Recent Labs  04/21/15 0139  NA 141  K 4.3  CL 104  CO2 26  GLUCOSE 124*  BUN 19  CREATININE 0.71  CALCIUM 9.3    Studies/Results: Dg Pelvis Portable  04/21/2015  CLINICAL DATA:  82 year old female with fall EXAM: PORTABLE PELVIS 1-2 VIEWS COMPARISON:  None. FINDINGS: Evaluation for fracture is limited due to osteopenia. No definite acute fracture identified. There is no dislocation. There is advanced osteopenia. There are degenerative changes of the visualized lower spine. Moderate stool noted throughout the colon. IMPRESSION: Osteopenia.  No definite acute fracture. Electronically Signed   By: Elgie Collard M.D.   On: 04/21/2015 01:57    Assessment/Plan:   LOS: 2 days  Discussed with pt the need to wear collar at all times, need to have assistance with baths to ensure safety/collar. Return to office in 1 month with repeat x-rays. She verbalizes understanding.    Georgiann Cocker 04/22/2015, 7:34 AM

## 2015-04-22 NOTE — Progress Notes (Signed)
Physical Therapy Note  These are the G-code for PT eval on 2/6 at 1500.    May 10, 2015 1530  PT G-Codes **NOT FOR INPATIENT CLASS**  Functional Assessment Tool Used clinical judgement  Functional Limitation Mobility: Walking and moving around  Mobility: Walking and Moving Around Current Status (N8295) CJ  Mobility: Walking and Moving Around Goal Status (A2130) CI    Lewis Shock, PT, DPT Pager #: [phone] Office #: [phone]

## 2015-04-22 NOTE — Discharge Summary (Signed)
Physician Discharge Summary  Melinda Warner WUJ:[id number] DOB: 1925/11/22 DOA: 04/20/2015  PCP: No primary care provider on file.  Admit date: 04/20/2015 Discharge date: 04/22/2015  Time spent: 35 minutes  Recommendations for Outpatient Follow-up:  1. Patient was admitted for C1 and C2 fracture, evaluated by neurosurgery who recommended Vista Collar. She does not require surgical intervention. 2. Plan to transition to skilled nursing facility for acute rehabilitation   Discharge Diagnoses:  Principal Problem:   C1 cervical fracture (HCC) Active Problems:   Chronic atrial fibrillation (HCC)   Dementia without behavioral disturbance   Recurrent falls   Discharge Condition: Stable  Diet recommendation: Heart healthy  Filed Weights   04/20/15 2147  Weight: 54.023 kg (119 lb 1.6 oz)    History of present illness:  Melinda Warner is a 82 y.o. female with history of atrial fibrillation and had a fall at her house when patient was trying to walk. Patient states she got up from the bed and was trying to walk when she lost her balance and fell. She hit her head. Denies losing consciousness. Denies any palpitations or shortness of breath. After the fall patient had neck. CT scan of the head and neck done showed acute fracture involving the dens with mild posterior displacement and acute Jefferson fracture of the bilateral posterior arches of C1. Since patient will require neurosurgery follow-up Dr. Venetia Maxon, on-call neurosurgeon was consulted and patient was transferred to [facility]. On my exam patient is not in acute distress and is moving all extremities. Reviewing patient's labs at [facility] patient's digoxin level was 1.4 rest of the labs were largely unremarkable. EKG was showing A. fib with PVCs. Rate control.  Hospital Course:  Melinda Warner is a pleasant 83 year old female with a past medical history of atrial fibrillation, history recurrent falls who had a fall at home injuring her  head. She initially presented to outside facility where imaging studies revealed a C1 and C2 fracture. Patient was transferred to [facility] for neurosurgical evaluation. She was seen and evaluated by Dr. Venetia Maxon of neurosurgery. A Vista Collar was placed. Dr Venetia Maxon did not recommend surgical intervention and to follow conservatively. She otherwise remained stable, neurologically intact. With regard to her A. fib she was rate controlled. During this hospitalization she was seen and evaluated by physical therapy who recommended rehabilitation at skilled nursing facility. Social work was consulted for SNF placement.   Consultations:  Neurosurgery  Discharge Exam: Filed Vitals:   04/22/15 0600 04/22/15 1020  BP: 131/65 104/50  Pulse: 80 96  Temp: 97.6 F (36.4 C) 98 F (36.7 C)  Resp: 16 16    General: No acute distress and bleeding around Cardiovascular: Regular rate and rhythm normal S1-S2 Respiratory: Clear to auscultation bilaterally Abdomen: Soft nontender nondistended Musculoskeletal: Neck brace in place  Discharge Instructions   Discharge Instructions    Call MD for:  difficulty breathing, headache or visual disturbances    Complete by:  As directed      Call MD for:  extreme fatigue    Complete by:  As directed      Call MD for:  hives    Complete by:  As directed      Call MD for:  persistant dizziness or light-headedness    Complete by:  As directed      Call MD for:  persistant nausea and vomiting    Complete by:  As directed      Call MD for:  redness, tenderness, or  signs of infection (pain, swelling, redness, odor or green/yellow discharge around incision site)    Complete by:  As directed      Call MD for:  severe uncontrolled pain    Complete by:  As directed      Call MD for:  temperature >100.4    Complete by:  As directed      Call MD for:    Complete by:  As directed      Diet - low sodium heart healthy    Complete by:  As directed      Increase  activity slowly    Complete by:  As directed           Current Discharge Medication List    CONTINUE these medications which have CHANGED   Details  traMADol (ULTRAM) 50 MG tablet Take 1 tablet (50 mg total) by mouth every 6 (six) hours as needed for moderate pain. Qty: 30 tablet, Refills: 0      CONTINUE these medications which have NOT CHANGED   Details  alendronate (FOSAMAX) 70 MG tablet Take 70 mg by mouth once a week. Take with a full glass of water on an empty stomach.    digoxin (LANOXIN) 0.125 MG tablet Take 0.125 mg by mouth daily.    diltiazem (DILACOR XR) 120 MG 24 hr capsule Take 120 mg by mouth daily.    donepezil (ARICEPT ODT) 10 MG disintegrating tablet Take 10 mg by mouth at bedtime.    nabumetone (RELAFEN) 500 MG tablet Take 500 mg by mouth 2 (two) times daily as needed for mild pain.      STOP taking these medications     potassium chloride SA (K-DUR,KLOR-CON) 20 MEQ tablet        Allergies  Allergen Reactions  . Tuberculin Other (See Comments)    Unknown reaction   Follow-up Information    Follow up with STERN,JOSEPH D, MD In 2 weeks.   Specialty:  Neurosurgery   Contact information:   [address] [place] [address] [phone]        The results of significant diagnostics from this hospitalization (including imaging, microbiology, ancillary and laboratory) are listed below for reference.    Significant Diagnostic Studies: Dg Pelvis Portable  04/21/2015  CLINICAL DATA:  81 year old female with fall EXAM: PORTABLE PELVIS 1-2 VIEWS COMPARISON:  None. FINDINGS: Evaluation for fracture is limited due to osteopenia. No definite acute fracture identified. There is no dislocation. There is advanced osteopenia. There are degenerative changes of the visualized lower spine. Moderate stool noted throughout the colon. IMPRESSION: Osteopenia.  No definite acute fracture. Electronically Signed   By: Elgie Collard M.D.   On: 04/21/2015  01:57    Microbiology: No results found for this or any previous visit (from the past 240 hour(s)).   Labs: Basic Metabolic Panel:  Recent Labs Lab 04/21/15 0139  NA 141  K 4.3  CL 104  CO2 26  GLUCOSE 124*  BUN 19  CREATININE 0.71  CALCIUM 9.3   Liver Function Tests:  Recent Labs Lab 04/21/15 0139  AST 24  ALT 14  ALKPHOS 48  BILITOT 1.1  PROT 5.8*  ALBUMIN 3.5   No results for input(s): LIPASE, AMYLASE in the last 168 hours. No results for input(s): AMMONIA in the last 168 hours. CBC:  Recent Labs Lab 04/21/15 0139  WBC 4.7  NEUTROABS 3.3  HGB 11.4*  HCT 33.9*  MCV 100.3*  PLT 181  Cardiac Enzymes: No results for input(s): CKTOTAL, CKMB, CKMBINDEX, TROPONINI in the last 168 hours. BNP: BNP (last 3 results) No results for input(s): BNP in the last 8760 hours.  ProBNP (last 3 results) No results for input(s): PROBNP in the last 8760 hours.  CBG: No results for input(s): GLUCAP in the last 168 hours.     Signed:  Jeralyn Bennett MD.  [facility] 04/22/2015, 1:10 PM

## 2015-04-22 NOTE — Clinical Social Work Note (Signed)
Patient has bed at [facility]. Patient to d/c to SNF once medically stable.  BSW intern remains available if any Social Work concerns arises.  Arnaldo Natal BSW intern [phone]

## 2015-04-22 NOTE — Clinical Social Work Placement (Signed)
   CLINICAL SOCIAL WORK PLACEMENT  NOTE  Date:  04/22/2015  Patient Details  Name: Melinda Warner MRN: [id number] Date of Birth: 02/04/1926  Clinical Social Work is seeking post-discharge placement for this patient at the Skilled  Nursing Facility level of care (*CSW will initial, date and re-position this form in  chart as items are completed):  Yes   Patient/family provided with [place] Clinical Social Work Department's list of facilities offering this level of care within the geographic area requested by the patient (or if unable, by the patient's family).  Yes   Patient/family informed of their freedom to choose among providers that offer the needed level of care, that participate in Medicare, Medicaid or managed care program needed by the patient, have an available bed and are willing to accept the patient.  Yes   Patient/family informed of 's ownership interest in [facility] and [facility], as well as of the fact that they are under no obligation to receive care at these facilities.  PASRR submitted to EDS on 04/22/15     PASRR number received on 04/22/15     Existing PASRR number confirmed on       FL2 transmitted to all facilities in geographic area requested by pt/family on 04/22/15     FL2 transmitted to all facilities within larger geographic area on       Patient informed that his/her managed care company has contracts with or will negotiate with certain facilities, including the following:        Yes   Patient/family informed of bed offers received.  Patient chooses bed at  [facility])     Physician recommends and patient chooses bed at      Patient to be transferred to  [facility]) on 04/22/15.  Patient to be transferred to facility by  Sharin Mons )     Patient family notified on 04/22/15 of transfer.  Name of family member notified:   (Pt's family at bedside. )     PHYSICIAN Please sign FL2      Additional Comment:    _______________________________________________ Vaughan Browner, LCSW 04/22/2015, 2:14 PM

## 2015-04-22 NOTE — Progress Notes (Signed)
Patient is discharged from room 5M13 at this time. Alert and in stable condition. IV site d/c'd. Report called in to receiving nurse Marylin Crosby, LPN at [facility]. Transported out of unit via stretcher by [facility] with all belongings at side.

## 2015-04-22 NOTE — Clinical Social Work Note (Signed)
Clinical Social Work Assessment  Patient Details  Name: Melinda Warner MRN: [id number] Date of Birth: 29-Jan-1926  Date of referral:  04/22/15               Reason for consult:  Facility Placement, Discharge Planning                Permission sought to share information with:  Family Supports Permission granted to share information::  Yes, Verbal Permission Granted  Name::      (Cranford,Penny)  Agency::   (SNF)  Relationship::   (daughter, Lavell Anchors)  Contact Information:   [phone])  Housing/Transportation Living arrangements for the past 2 months:  Single Family Home Source of Information:  Adult Children Patient Interpreter Needed:  None Criminal Activity/Legal Involvement Pertinent to Current Situation/Hospitalization:  No - Comment as needed Significant Relationships:  Adult Children, Siblings Lives with:  Adult Children, Siblings Do you feel safe going back to the place where you live?  No Need for family participation in patient care:  Yes (Comment)  Care giving concerns:   Patient's family has not expressed any care giving concerns at this time.   Social Worker assessment / plan:   BSW intern has spoken with patient's family at bedside to discuss discharge disposition. BSW intern has presented SNF list to patient's family. BSW intern further explained SNF process as well as insurance coverage while at SNF. Patient's family has informed BSW intern that they prefer [facility] if possible. Both family and patient are familiar with this facility and were pleased with the services that were provided in the past. BSW intern to fax patient's clinical to facility for review. BSW intern to follow up with patient's family with extended bed offers. BSW intern remains available if any further Social Work needs arises.   Employment status:  Retired Health and safety inspector:  [employer] PT Recommendations:  Skilled Nursing Facility Information / Referral to community  resources:  Skilled Nursing Facility  Patient/Family's Response to care:   Patient's family appreciated Social Work intervention from [employer].  Patient's family is hopeful that patient will return home once medically stable.  Patient/Family's Understanding of and Emotional Response to Diagnosis, Current Treatment, and Prognosis:   Patient's family understands need for further medical care and rehab at [facility].  Emotional Assessment Appearance:   (unable to assess) Attitude/Demeanor/Rapport:  Unable to Assess Affect (typically observed):  Unable to Assess Orientation:  Oriented to Self, Oriented to Place, Oriented to  Time, Oriented to Situation Alcohol / Substance use:  Not Applicable Psych involvement (Current and /or in the community):  No (Comment)  Discharge Needs  Concerns to be addressed:  No discharge needs identified Readmission within the last 30 days:  No Current discharge risk:  None Barriers to Discharge:  No Barriers Identified   Orson Gear, Student-SW 04/22/2015, 10:32 AM

## 2016-11-11 ENCOUNTER — Other Ambulatory Visit: Payer: Self-pay | Admitting: Cardiology

## 2016-11-18 ENCOUNTER — Other Ambulatory Visit: Payer: Self-pay | Admitting: Cardiology

## 2017-01-21 IMAGING — CR DG PORTABLE PELVIS
1 series · 1 of 1 positions shown · non-contrast
Comparison: None.

CLINICAL DATA: 89-year-old female with fall

EXAM:
PORTABLE PELVIS 1-2 VIEWS

[AP]
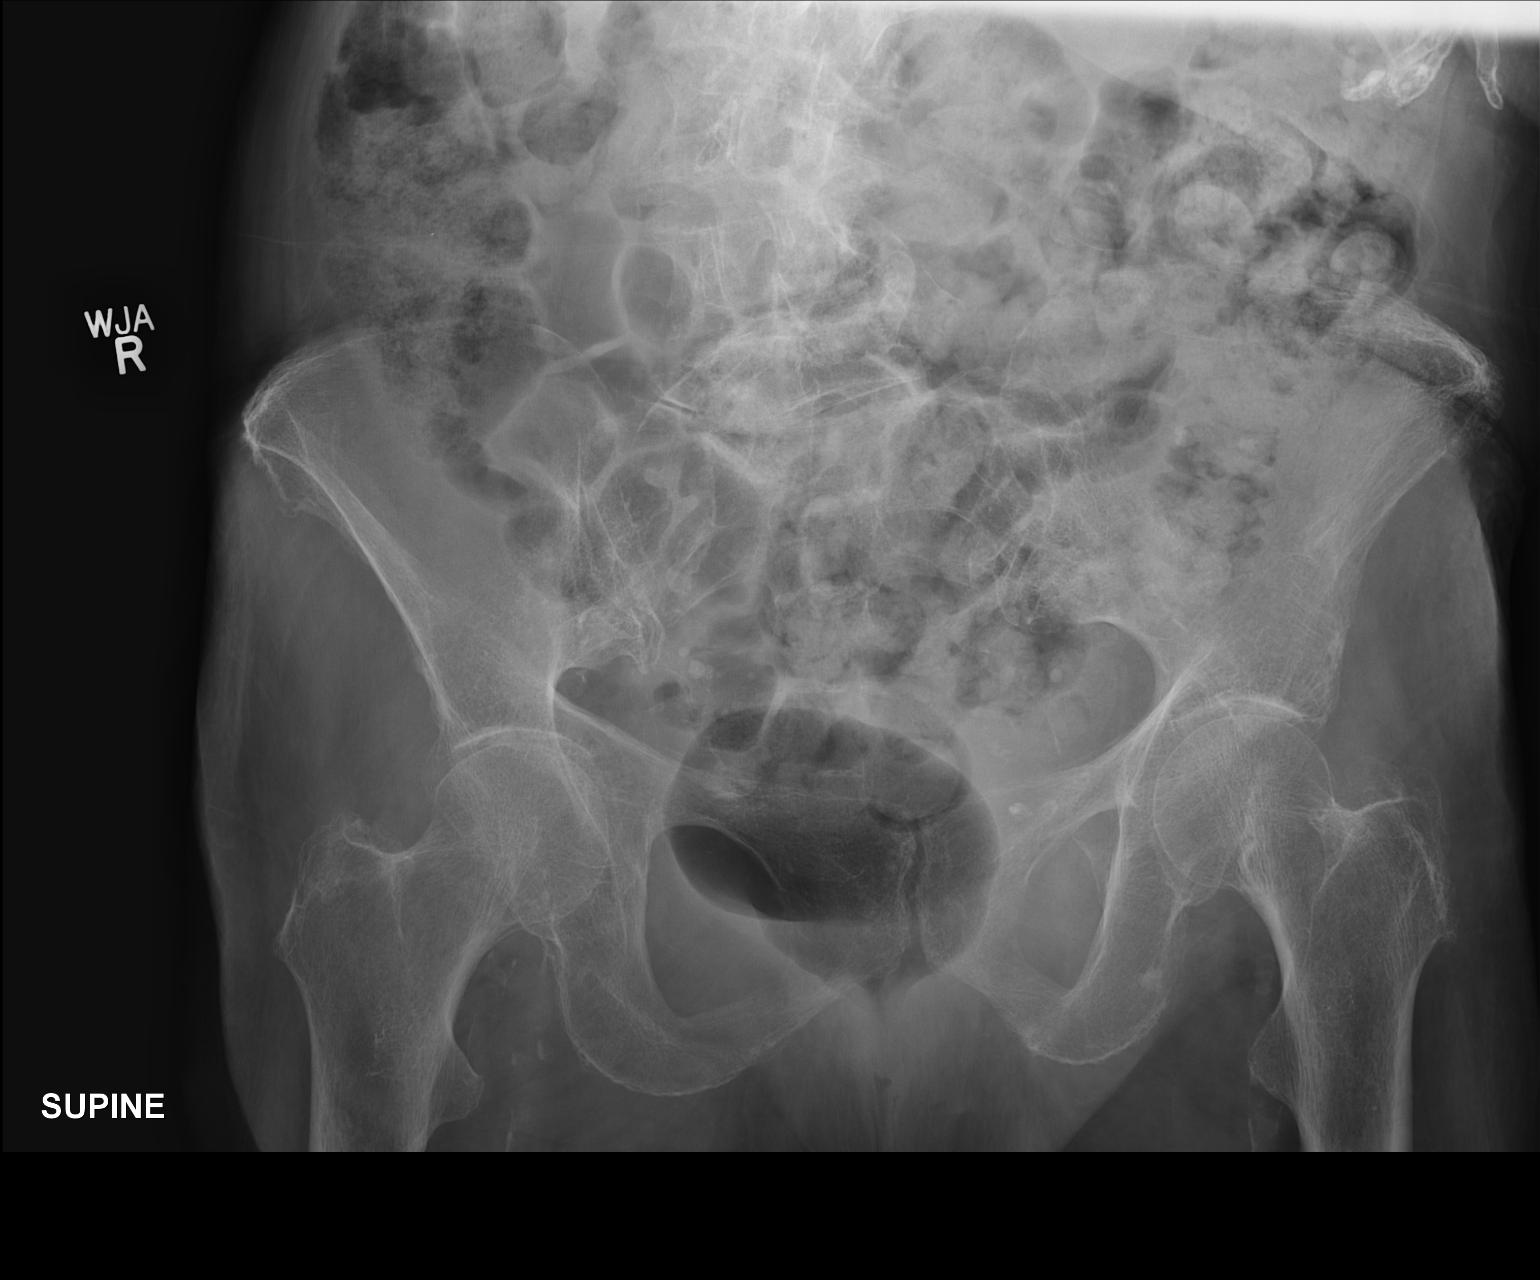

[1 of 1 positions shown; findings below may reference images not displayed]

FINDINGS: Evaluation for fracture is limited due to osteopenia. No definite
acute fracture identified. There is no dislocation. There is
advanced osteopenia. There are degenerative changes of the
visualized lower spine. Moderate stool noted throughout the colon.
IMPRESSION: Osteopenia.  No definite acute fracture.

## 2017-07-03 DIAGNOSIS — I5023 Acute on chronic systolic (congestive) heart failure: Secondary | ICD-10-CM

## 2017-07-03 DIAGNOSIS — N179 Acute kidney failure, unspecified: Secondary | ICD-10-CM | POA: Diagnosis not present

## 2017-07-03 DIAGNOSIS — N390 Urinary tract infection, site not specified: Secondary | ICD-10-CM | POA: Diagnosis not present

## 2017-07-03 DIAGNOSIS — Z9181 History of falling: Secondary | ICD-10-CM

## 2017-07-03 DIAGNOSIS — I130 Hypertensive heart and chronic kidney disease with heart failure and stage 1 through stage 4 chronic kidney disease, or unspecified chronic kidney disease: Secondary | ICD-10-CM

## 2017-07-04 DIAGNOSIS — Z9181 History of falling: Secondary | ICD-10-CM | POA: Diagnosis not present

## 2017-07-04 DIAGNOSIS — N390 Urinary tract infection, site not specified: Secondary | ICD-10-CM | POA: Diagnosis not present

## 2017-07-04 DIAGNOSIS — I130 Hypertensive heart and chronic kidney disease with heart failure and stage 1 through stage 4 chronic kidney disease, or unspecified chronic kidney disease: Secondary | ICD-10-CM | POA: Diagnosis not present

## 2017-07-04 DIAGNOSIS — I5023 Acute on chronic systolic (congestive) heart failure: Secondary | ICD-10-CM | POA: Diagnosis not present

## 2017-07-05 DIAGNOSIS — N390 Urinary tract infection, site not specified: Secondary | ICD-10-CM | POA: Diagnosis not present

## 2017-07-05 DIAGNOSIS — I130 Hypertensive heart and chronic kidney disease with heart failure and stage 1 through stage 4 chronic kidney disease, or unspecified chronic kidney disease: Secondary | ICD-10-CM | POA: Diagnosis not present

## 2017-07-05 DIAGNOSIS — Z9181 History of falling: Secondary | ICD-10-CM | POA: Diagnosis not present

## 2017-07-05 DIAGNOSIS — A419 Sepsis, unspecified organism: Secondary | ICD-10-CM | POA: Diagnosis not present

## 2017-07-05 DIAGNOSIS — I5023 Acute on chronic systolic (congestive) heart failure: Secondary | ICD-10-CM | POA: Diagnosis not present

## 2017-07-06 DIAGNOSIS — I130 Hypertensive heart and chronic kidney disease with heart failure and stage 1 through stage 4 chronic kidney disease, or unspecified chronic kidney disease: Secondary | ICD-10-CM | POA: Diagnosis not present

## 2017-07-06 DIAGNOSIS — Z9181 History of falling: Secondary | ICD-10-CM | POA: Diagnosis not present

## 2017-07-06 DIAGNOSIS — I5023 Acute on chronic systolic (congestive) heart failure: Secondary | ICD-10-CM | POA: Diagnosis not present

## 2017-07-06 DIAGNOSIS — N390 Urinary tract infection, site not specified: Secondary | ICD-10-CM | POA: Diagnosis not present

## 2017-07-06 DIAGNOSIS — N179 Acute kidney failure, unspecified: Secondary | ICD-10-CM | POA: Diagnosis not present

## 2017-07-07 DIAGNOSIS — N390 Urinary tract infection, site not specified: Secondary | ICD-10-CM | POA: Diagnosis not present

## 2017-07-07 DIAGNOSIS — I5023 Acute on chronic systolic (congestive) heart failure: Secondary | ICD-10-CM | POA: Diagnosis not present

## 2017-07-07 DIAGNOSIS — Z9181 History of falling: Secondary | ICD-10-CM | POA: Diagnosis not present

## 2017-07-07 DIAGNOSIS — I130 Hypertensive heart and chronic kidney disease with heart failure and stage 1 through stage 4 chronic kidney disease, or unspecified chronic kidney disease: Secondary | ICD-10-CM | POA: Diagnosis not present

## 2017-07-08 DIAGNOSIS — I5023 Acute on chronic systolic (congestive) heart failure: Secondary | ICD-10-CM | POA: Diagnosis not present

## 2017-07-08 DIAGNOSIS — I130 Hypertensive heart and chronic kidney disease with heart failure and stage 1 through stage 4 chronic kidney disease, or unspecified chronic kidney disease: Secondary | ICD-10-CM | POA: Diagnosis not present

## 2017-07-08 DIAGNOSIS — N390 Urinary tract infection, site not specified: Secondary | ICD-10-CM | POA: Diagnosis not present

## 2017-07-08 DIAGNOSIS — Z9181 History of falling: Secondary | ICD-10-CM | POA: Diagnosis not present

## 2017-07-19 DIAGNOSIS — I639 Cerebral infarction, unspecified: Secondary | ICD-10-CM | POA: Diagnosis not present

## 2017-07-19 DIAGNOSIS — J690 Pneumonitis due to inhalation of food and vomit: Secondary | ICD-10-CM

## 2017-07-19 DIAGNOSIS — E876 Hypokalemia: Secondary | ICD-10-CM | POA: Diagnosis not present

## 2017-07-19 DIAGNOSIS — R4182 Altered mental status, unspecified: Secondary | ICD-10-CM | POA: Diagnosis not present

## 2017-08-13 DEATH — deceased
